# Patient Record
Sex: Male | Born: 1975 | Race: Black or African American | Hispanic: No | Marital: Single | Smoking: Never smoker
Health system: Southern US, Community
[De-identification: ages and names within clinical notes are randomized; demographics above are authoritative.]

## PROBLEM LIST (undated history)

## (undated) DIAGNOSIS — I10 Essential (primary) hypertension: Secondary | ICD-10-CM

---

## 2009-08-15 ENCOUNTER — Ambulatory Visit: Payer: Self-pay | Admitting: Unknown Physician Specialty

## 2009-09-03 ENCOUNTER — Ambulatory Visit: Payer: Self-pay | Admitting: Unknown Physician Specialty

## 2009-10-04 ENCOUNTER — Ambulatory Visit: Payer: Self-pay | Admitting: Unknown Physician Specialty

## 2009-11-03 ENCOUNTER — Ambulatory Visit: Payer: Self-pay | Admitting: Unknown Physician Specialty

## 2009-12-04 ENCOUNTER — Ambulatory Visit: Payer: Self-pay | Admitting: Unknown Physician Specialty

## 2020-02-02 ENCOUNTER — Emergency Department (HOSPITAL_COMMUNITY)
Admission: EM | Admit: 2020-02-02 | Discharge: 2020-02-02 | Disposition: A | Discharge status: Home / Self Care | Payer: BC Managed Care – PPO | Attending: Emergency Medicine | Admitting: Emergency Medicine

## 2020-02-02 ENCOUNTER — Emergency Department (HOSPITAL_COMMUNITY): Payer: BC Managed Care – PPO

## 2020-02-02 ENCOUNTER — Encounter (HOSPITAL_COMMUNITY): Payer: Self-pay | Admitting: Emergency Medicine

## 2020-02-02 ENCOUNTER — Other Ambulatory Visit: Payer: Self-pay

## 2020-02-02 DIAGNOSIS — Y93I9 Activity, other involving external motion: Secondary | ICD-10-CM | POA: Insufficient documentation

## 2020-02-02 DIAGNOSIS — I10 Essential (primary) hypertension: Secondary | ICD-10-CM | POA: Insufficient documentation

## 2020-02-02 DIAGNOSIS — M25511 Pain in right shoulder: Secondary | ICD-10-CM | POA: Diagnosis not present

## 2020-02-02 DIAGNOSIS — Y92410 Unspecified street and highway as the place of occurrence of the external cause: Secondary | ICD-10-CM | POA: Insufficient documentation

## 2020-02-02 DIAGNOSIS — M25562 Pain in left knee: Secondary | ICD-10-CM | POA: Insufficient documentation

## 2020-02-02 DIAGNOSIS — M25569 Pain in unspecified knee: Secondary | ICD-10-CM | POA: Diagnosis present

## 2020-02-02 HISTORY — DX: Essential (primary) hypertension: I10

## 2020-02-02 MED ORDER — NAPROXEN 500 MG PO TABS: 500.0000 mg | ORAL_TABLET | Freq: Two times a day (BID) | ORAL | 0 refills | Status: AC | PRN

## 2020-02-02 MED ORDER — METHOCARBAMOL 500 MG PO TABS: 500.0000 mg | ORAL_TABLET | Freq: Three times a day (TID) | ORAL | 0 refills | Status: AC | PRN

## 2020-02-02 MED ORDER — NAPROXEN 250 MG PO TABS
500.0000 mg | ORAL_TABLET | Freq: Once | ORAL | Status: AC
  Administered 2020-02-02: 500 mg via ORAL
  Filled 2020-02-02: qty 2
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
